# Patient Record
Sex: Male | Born: 1970 | Race: White | Hispanic: No | Marital: Married | Smoking: Never smoker
Health system: Southern US, Community
[De-identification: ages and names within clinical notes are randomized; demographics above are authoritative.]

## PROBLEM LIST (undated history)

## (undated) DIAGNOSIS — E119 Type 2 diabetes mellitus without complications: Secondary | ICD-10-CM

## (undated) DIAGNOSIS — E669 Obesity, unspecified: Secondary | ICD-10-CM

---

## 1999-01-28 ENCOUNTER — Encounter: Payer: Self-pay | Admitting: Family Medicine

## 1999-01-28 ENCOUNTER — Ambulatory Visit (HOSPITAL_COMMUNITY): Admission: RE | Admit: 1999-01-28 | Discharge: 1999-01-28 | Payer: Self-pay | Admitting: Family Medicine

## 2000-04-17 ENCOUNTER — Other Ambulatory Visit: Admission: RE | Admit: 2000-04-17 | Discharge: 2000-04-17 | Payer: Self-pay | Admitting: Family Medicine

## 2003-03-17 ENCOUNTER — Emergency Department (HOSPITAL_COMMUNITY): Admission: EM | Admit: 2003-03-17 | Discharge: 2003-03-18 | Payer: Self-pay

## 2009-01-28 ENCOUNTER — Emergency Department (HOSPITAL_COMMUNITY): Admission: EM | Admit: 2009-01-28 | Discharge: 2009-01-28 | Payer: Self-pay | Admitting: Emergency Medicine

## 2009-02-12 ENCOUNTER — Ambulatory Visit (HOSPITAL_COMMUNITY): Admission: RE | Admit: 2009-02-12 | Discharge: 2009-02-13 | Payer: Self-pay | Admitting: Neurosurgery

## 2009-12-14 ENCOUNTER — Inpatient Hospital Stay (HOSPITAL_COMMUNITY): Admission: AD | Admit: 2009-12-14 | Discharge: 2009-12-16 | Payer: Self-pay | Admitting: Internal Medicine

## 2010-10-21 LAB — HEMOGLOBIN A1C: Hgb A1c MFr Bld: 10.9 % — ABNORMAL HIGH (ref <5.7)

## 2010-10-21 LAB — BASIC METABOLIC PANEL
BUN: 7 mg/dL (ref 6–23)
CO2: 24 mEq/L (ref 19–32)
Calcium: 8.7 mg/dL (ref 8.4–10.5)
Chloride: 105 mEq/L (ref 96–112)
Creatinine, Ser: 0.76 mg/dL (ref 0.4–1.5)
GFR calc Af Amer: >60 mL/min (ref >60)
GFR calc non Af Amer: >60 mL/min (ref >60)
Glucose, Bld: 253 mg/dL — ABNORMAL HIGH (ref 70–99)
Potassium: 3.9 mEq/L (ref 3.5–5.1)
Sodium: 136 mEq/L (ref 135–145)

## 2010-10-21 LAB — CBC
HCT: 45.2 % (ref 39.0–52.0)
Hemoglobin: 15.7 g/dL (ref 13.0–17.0)
MCHC: 34.7 g/dL (ref 30.0–36.0)
MCV: 90.9 fL (ref 78.0–100.0)
Platelets: 158 10*3/uL (ref 150–400)
RBC: 4.97 MIL/uL (ref 4.22–5.81)
RDW: 12.9 % (ref 11.5–15.5)
WBC: 5.9 10*3/uL (ref 4.0–10.5)

## 2010-10-21 LAB — GLUCOSE, CAPILLARY: Glucose-Capillary: 248 mg/dL — ABNORMAL HIGH (ref 70–99)

## 2010-10-22 LAB — DIFFERENTIAL
Eosinophils Absolute: 0.3 10*3/uL (ref 0.0–0.7)
Lymphocytes Relative: 24 % (ref 12–46)
Lymphs Abs: 2 10*3/uL (ref 0.7–4.0)
Monocytes Relative: 9 % (ref 3–12)
Neutrophils Relative %: 64 % (ref 43–77)

## 2010-10-22 LAB — CBC
MCHC: 35.2 g/dL (ref 30.0–36.0)
MCV: 89.6 fL (ref 78.0–100.0)
RDW: 12.3 % (ref 11.5–15.5)

## 2010-10-22 LAB — URINALYSIS, ROUTINE W REFLEX MICROSCOPIC
Bilirubin Urine: NEGATIVE
Hgb urine dipstick: NEGATIVE
Nitrite: NEGATIVE
Protein, ur: NEGATIVE mg/dL
Urobilinogen, UA: 0.2 mg/dL (ref 0.0–1.0)

## 2010-10-22 LAB — URINE CULTURE
Colony Count: NO GROWTH
Culture: NO GROWTH
Special Requests: NEGATIVE

## 2010-10-22 LAB — COMPREHENSIVE METABOLIC PANEL
ALT: 86 U/L — ABNORMAL HIGH (ref 0–53)
AST: 73 U/L — ABNORMAL HIGH (ref 0–37)
CO2: 28 mEq/L (ref 19–32)
Calcium: 8.8 mg/dL (ref 8.4–10.5)
Creatinine, Ser: 0.88 mg/dL (ref 0.4–1.5)
GFR calc Af Amer: >60 mL/min (ref >60)
GFR calc non Af Amer: >60 mL/min (ref >60)
Glucose, Bld: 252 mg/dL — ABNORMAL HIGH (ref 70–99)
Sodium: 134 mEq/L — ABNORMAL LOW (ref 135–145)
Total Protein: 6.1 g/dL (ref 6.0–8.3)

## 2010-10-22 LAB — GLUCOSE, CAPILLARY
Glucose-Capillary: 238 mg/dL — ABNORMAL HIGH (ref 70–99)
Glucose-Capillary: 295 mg/dL — ABNORMAL HIGH (ref 70–99)

## 2010-10-22 LAB — MAGNESIUM: Magnesium: 2 mg/dL (ref 1.5–2.5)

## 2010-10-22 LAB — PHOSPHORUS: Phosphorus: 2.8 mg/dL (ref 2.3–4.6)

## 2010-11-10 LAB — BASIC METABOLIC PANEL
BUN: 12 mg/dL (ref 6–23)
CO2: 28 mEq/L (ref 19–32)
Chloride: 103 mEq/L (ref 96–112)
Glucose, Bld: 158 mg/dL — ABNORMAL HIGH (ref 70–99)
Potassium: 4.2 mEq/L (ref 3.5–5.1)
Sodium: 139 mEq/L (ref 135–145)

## 2010-11-10 LAB — CBC
HCT: 51.7 % (ref 39.0–52.0)
Hemoglobin: 18.2 g/dL — ABNORMAL HIGH (ref 13.0–17.0)
MCHC: 35.1 g/dL (ref 30.0–36.0)
MCV: 90.2 fL (ref 78.0–100.0)
Platelets: 191 10*3/uL (ref 150–400)
RDW: 12.9 % (ref 11.5–15.5)

## 2010-12-17 NOTE — Op Note (Signed)
Hanson, Levi               ACCOUNT NO.:  [id number]   MEDICAL RECORD NO.:  [id number]          PATIENT TYPE:  OIB   LOCATION:  3528                         FACILITY:  [facility]   PHYSICIAN:  Coletta Memos, M.D.     DATE OF BIRTH:  Dec 14, 1970   DATE OF PROCEDURE:  02/12/2009  DATE OF DISCHARGE:                               OPERATIVE REPORT   PREOPERATIVE DIAGNOSES:  1. Displaced disk, left L5-S1.  2. Left S1 radiculopathy.   POSTOPERATIVE DIAGNOSES:  1. Displaced disk, left L5-S1.  2. Left S1 radiculopathy.   PROCEDURE:  Left L5-S1 semi-hemilaminectomy and diskectomy with  microdissection.   COMPLICATIONS:  None.   SURGEON:  Coletta Memos, MD   ASSISTANT:  Hewitt Shorts, MD   INDICATIONS:  Mr. Hicks is a young man, 40 year old, who presents with  acute onset of severe pain in his left lower extremity.  I had seen him  previously for back and left lower extremity pain.  We have been able to  treat this successfully using conservative means.  However, at this time  he has had an exacerbation and he is requesting surgery.  MRI shows a  herniated disk, which is now larger than it had been on the last 2 years  ago.  I offered and he agreed to undergo operative decompression.   OPERATIVE NOTE:  Mr. Komatsu was brought to the operating room intubated  and placed under general anesthetic.  He was rolled prone onto a Wilson  frame and all pressure points were properly padded.  His back was  prepped and he was draped in a sterile fashion.  I opened the skin after  infiltrating lidocaine 0.5% 1:200,000 epinephrine.  I opened the skin  and took this down through subcutaneous fat so I reached the  thoracolumbar fascia.  I then exposed the lamina of what turned out to  be on x-ray, L5-S1.  I placed self-retaining retractor and performed a  semi-hemilaminectomy of L5 using a high-speed drill.  I used a Kerrison  punch to remove more bone and then eventually removed the  ligamentum  flavum exposing the thecal sac in the L5-S1 interlaminar space.  I  retracted that medially and with Dr. Earl Gala assistance along with  microdissection we opened the disk space and removed what was a great  deal of degenerated disks as predicted by the MRI.  We did this until  the S1 root was fully  decompressed.  I then irrigated.  I then closed wound with Dr.  Earl Gala assistance at L5-S1.  We reapproximated the wound in the  layered fashion.   The nerve root was inspected prior to closure in the rostral, caudal,  medial, and lateral directions.           ______________________________  Coletta Memos, M.D.     KC/MEDQ  D:  02/12/2009  T:  02/13/2009  Job:  [id number]

## 2011-02-17 ENCOUNTER — Other Ambulatory Visit (HOSPITAL_COMMUNITY): Payer: Self-pay | Admitting: Family Medicine

## 2011-02-24 ENCOUNTER — Ambulatory Visit (HOSPITAL_COMMUNITY)
Admission: RE | Admit: 2011-02-24 | Discharge: 2011-02-24 | Disposition: A | Discharge status: Home / Self Care | Payer: Commercial Managed Care - PPO | Source: Ambulatory Visit | Attending: Family Medicine | Admitting: Family Medicine

## 2011-02-24 DIAGNOSIS — K7689 Other specified diseases of liver: Secondary | ICD-10-CM | POA: Insufficient documentation

## 2011-02-24 DIAGNOSIS — R945 Abnormal results of liver function studies: Secondary | ICD-10-CM | POA: Insufficient documentation

## 2013-12-24 ENCOUNTER — Emergency Department (HOSPITAL_COMMUNITY)
Admission: EM | Admit: 2013-12-24 | Discharge: 2013-12-24 | Disposition: A | Discharge status: Home / Self Care | Payer: Managed Care, Other (non HMO) | Attending: Emergency Medicine | Admitting: Emergency Medicine

## 2013-12-24 ENCOUNTER — Encounter (HOSPITAL_COMMUNITY): Payer: Self-pay | Admitting: Emergency Medicine

## 2013-12-24 DIAGNOSIS — Y9389 Activity, other specified: Secondary | ICD-10-CM | POA: Insufficient documentation

## 2013-12-24 DIAGNOSIS — E119 Type 2 diabetes mellitus without complications: Secondary | ICD-10-CM | POA: Insufficient documentation

## 2013-12-24 DIAGNOSIS — F17200 Nicotine dependence, unspecified, uncomplicated: Secondary | ICD-10-CM | POA: Insufficient documentation

## 2013-12-24 DIAGNOSIS — IMO0001 Reserved for inherently not codable concepts without codable children: Secondary | ICD-10-CM

## 2013-12-24 DIAGNOSIS — Z23 Encounter for immunization: Secondary | ICD-10-CM | POA: Insufficient documentation

## 2013-12-24 DIAGNOSIS — S61209A Unspecified open wound of unspecified finger without damage to nail, initial encounter: Secondary | ICD-10-CM | POA: Insufficient documentation

## 2013-12-24 DIAGNOSIS — E669 Obesity, unspecified: Secondary | ICD-10-CM | POA: Insufficient documentation

## 2013-12-24 DIAGNOSIS — W298XXA Contact with other powered powered hand tools and household machinery, initial encounter: Secondary | ICD-10-CM | POA: Insufficient documentation

## 2013-12-24 DIAGNOSIS — Y929 Unspecified place or not applicable: Secondary | ICD-10-CM | POA: Insufficient documentation

## 2013-12-24 HISTORY — DX: Obesity, unspecified: E66.9

## 2013-12-24 HISTORY — DX: Type 2 diabetes mellitus without complications: E11.9

## 2013-12-24 MED ORDER — TETANUS-DIPHTH-ACELL PERTUSSIS 5-2.5-18.5 LF-MCG/0.5 IM SUSP
0.5000 mL | Freq: Once | INTRAMUSCULAR | Status: AC
  Administered 2013-12-24: 0.5 mL via INTRAMUSCULAR
  Filled 2013-12-24: qty 0.5

## 2013-12-24 MED ORDER — HYDROCODONE-ACETAMINOPHEN 5-325 MG PO TABS: 1.0000 | ORAL_TABLET | Freq: Four times a day (QID) | ORAL | Status: AC | PRN

## 2013-12-24 NOTE — ED Notes (Signed)
Reports cutting left index finger when using hand saw. Bleeding controlled, unknown tetanus.

## 2013-12-24 NOTE — Discharge Instructions (Signed)
Have your suture removed in 7 days.  Follow instruction below.   Fingertip Injuries and Amputations Fingertip injuries are common and often get injured because they are last to escape when pulling your hand out of harm's way. You have amputated (cut off) part of your finger. How this turns out depends largely on how much was amputated. If just the tip is amputated, often the end of the finger will grow back and the finger may return to much the same as it was before the injury.  If more of the finger is missing, your caregiver has done the best with the tissue remaining to allow you to keep as much finger as is possible. Your caregiver after checking your injury has tried to leave you with a painless fingertip that has durable, feeling skin. If possible, your caregiver has tried to maintain the finger's length and appearance and preserve its fingernail.  Please read the instructions outlined below and refer to this sheet in the next few weeks. These instructions provide you with general information on caring for yourself. Your caregiver may also give you specific instructions. While your treatment has been done according to the most current medical practices available, unavoidable complications occasionally occur. If you have any problems or questions after discharge, please call your caregiver. HOME CARE INSTRUCTIONS   You may resume normal diet and activities as directed or allowed.  Keep your hand elevated above the level of your heart. This helps decrease pain and swelling.  Keep ice packs (or a bag of ice wrapped in a towel) on the injured area for 15-20 minutes, 03-04 times per day, for the first two days.  Change dressings if necessary or as directed.  Clean the wound daily or as directed.  Only take over-the-counter or prescription medicines for pain, discomfort, or fever as directed by your caregiver.  Keep appointments as directed. SEEK IMMEDIATE MEDICAL CARE IF:  You develop  redness, swelling, numbness or increasing pain in the wound.  There is pus coming from the wound.  You develop an unexplained oral temperature above 102 F (38.9 C) or as your caregiver suggests.  There is a foul (bad) smell coming from the wound or dressing.  There is a breaking open of the wound (edges not staying together) after sutures or staples have been removed. MAKE SURE YOU:   Understand these instructions.  Will watch your condition.  Will get help right away if you are not doing well or get worse. Document Released: 06/11/2005 Document Revised: 10/13/2011 Document Reviewed: 05/10/2008 [facility] Patient Information 2014 [place], [place].

## 2013-12-24 NOTE — ED Notes (Signed)
Laceration to left index finger with hand-saw. Continues to ooze blood. Direct pressure applied with DSD.

## 2013-12-24 NOTE — ED Provider Notes (Signed)
CSN: [id number]     Arrival date & time 12/24/13  1048 History  This chart was scribed for Fayrene Helper, PA-C, working with Nelia Shi, MD by Blanchard Kelch, ED Scribe. This patient was seen in room TR06C/TR06C and the patient's care was started at 11:46 AM.      Chief Complaint  Patient presents with  . Extremity Laceration     The history is provided by the patient. No language interpreter was used.    HPI Comments: Levi Hanson is a 43 y.o. male who presents to the Emergency Department due to a left index finger injury that occurred an hour ago. He was using a hand saw when it slipped and cut his left index finger. The area is actively bleeding but is controlled with pressure. He reports constant pain to the affected finger. He is not up to date on his Tetanus vaccination. Denies numbness.  Is RHD.     Past Medical History  Diagnosis Date  . Diabetes mellitus without complication   . Obesity    History reviewed. No pertinent past surgical history. History reviewed. No pertinent family history. History  Substance Use Topics  . Smoking status: Current Every Day Smoker    Types: Cigarettes  . Smokeless tobacco: Not on file  . Alcohol Use: No    Review of Systems  Constitutional: Negative for fever and chills.  Musculoskeletal: Negative for arthralgias.  Skin: Positive for wound.      Allergies  Zithromax  Home Medications   Prior to Admission medications   Not on File   Triage Vitals: BP 142/91  Pulse 84  Temp(Src) 98 F (36.7 C) (Oral)  Resp 18  SpO2 98%  Physical Exam  Nursing note and vitals reviewed. Constitutional: He is oriented to person, place, and time. He appears well-developed and well-nourished. No distress.  HENT:  Head: Normocephalic and atraumatic.  Eyes: EOM are normal.  Neck: Neck supple. No tracheal deviation present.  Cardiovascular: Normal rate and intact distal pulses.   Pulmonary/Chest: Effort normal. No respiratory distress.   Musculoskeletal: Normal range of motion.  Neurological: He is alert and oriented to person, place, and time.  Skin: Skin is warm and dry.  Left index finger 2 cm jagged skin laceration in oblique fashion to the pad of the finger medially. Actively bleeding. Intact distal pulses with brisk cap refill and no nail involvement. No joint involvement.   Psychiatric: He has a normal mood and affect. His behavior is normal.    ED Course  Procedures (including critical care time)  DIAGNOSTIC STUDIES: Oxygen Saturation is 98% on room air, normal by my interpretation.    COORDINATION OF CARE: 11:49 AM -Will repair laceration. Patient verbalizes understanding and agrees with treatment plan.  LACERATION REPAIR Performed by: Fayrene Helper Authorized by: Fayrene Helper Consent: Verbal consent obtained. Risks and benefits: risks, benefits and alternatives were discussed Consent given by: patient Patient identity confirmed: provided demographic data Prepped and Draped in normal sterile fashion Wound explored  Laceration Location: L index finger, pad of finger  Laceration Length: 2cm  No Foreign Bodies seen or palpated  Anesthesia: digital nerve block  Local anesthetic: lidocaine 2% w/o epinephrine  Anesthetic total: 4 ml  Irrigation method: syringe Amount of cleaning: standard  Skin closure: prolene 5.0, and sterile strip  Number of sutures: 1  Technique: simple interrupted, skin approximation, sharp debridement  Patient tolerance: Patient tolerated the procedure well with no immediate complications.   Labs Review Labs Reviewed -  No data to display  Imaging Review No results found.   EKG Interpretation None      MDM   Final diagnoses:  Laceration of second finger of left hand    BP 142/91  Pulse 84  Temp(Src) 98 F (36.7 C) (Oral)  Resp 18  SpO2 98%  I personally performed the services described in this documentation, which was scribed in my presence. The recorded  information has been reviewed and is accurate.    Fayrene HelperBowie Chadwin Fury, PA-C 12/24/13 1251

## 2013-12-24 NOTE — ED Provider Notes (Signed)
Medical screening examination/treatment/procedure(s) were performed by non-physician practitioner and as supervising physician I was immediately available for consultation/collaboration.    Saanvika Vazques L Averly Ericson, MD 12/24/13 1401 

## 2015-05-28 ENCOUNTER — Other Ambulatory Visit: Payer: Self-pay | Admitting: Orthopedic Surgery

## 2015-06-25 ENCOUNTER — Encounter (HOSPITAL_BASED_OUTPATIENT_CLINIC_OR_DEPARTMENT_OTHER): Admission: RE | Payer: Self-pay | Source: Ambulatory Visit

## 2015-06-25 ENCOUNTER — Ambulatory Visit (HOSPITAL_BASED_OUTPATIENT_CLINIC_OR_DEPARTMENT_OTHER)
Admission: RE | Admit: 2015-06-25 | Payer: Managed Care, Other (non HMO) | Source: Ambulatory Visit | Admitting: Orthopedic Surgery

## 2015-06-25 SURGERY — SHOULDER ARTHROSCOPY WITH SUBACROMIAL DECOMPRESSION
Anesthesia: Choice | Site: Shoulder | Laterality: Right

## 2016-01-04 ENCOUNTER — Ambulatory Visit: Payer: Managed Care, Other (non HMO) | Admitting: Podiatry

## 2016-02-22 ENCOUNTER — Other Ambulatory Visit: Payer: Self-pay | Admitting: Physician Assistant

## 2016-02-22 DIAGNOSIS — M79672 Pain in left foot: Secondary | ICD-10-CM

## 2016-03-05 ENCOUNTER — Ambulatory Visit
Admission: RE | Admit: 2016-03-05 | Discharge: 2016-03-05 | Disposition: A | Discharge status: Home / Self Care | Payer: Managed Care, Other (non HMO) | Source: Ambulatory Visit | Attending: Physician Assistant | Admitting: Physician Assistant

## 2016-03-05 DIAGNOSIS — M79672 Pain in left foot: Secondary | ICD-10-CM

## 2016-03-05 MED ORDER — GADOBENATE DIMEGLUMINE 529 MG/ML IV SOLN
20.0000 mL | Freq: Once | INTRAVENOUS | Status: AC | PRN
  Administered 2016-03-05: 20 mL via INTRAVENOUS

## 2017-11-30 ENCOUNTER — Ambulatory Visit: Payer: Managed Care, Other (non HMO) | Admitting: Family Medicine

## 2017-12-04 ENCOUNTER — Encounter: Payer: Self-pay | Admitting: Family Medicine

## 2017-12-22 IMAGING — MR MR FOOT*L* WO/W CM
8 of 9 series · 33 of 40 positions shown · IV contrast (multihance)
Comparison: None.

CLINICAL DATA: Dorsal left foot and ankle pain and swelling for 2
weeks in a diabetic patient.

Creatinine was obtained on site at [HOSPITAL] at [HOSPITAL].Results: Creatinine 0.8 mg/dL.
EXAM:
MRI OF THE LEFT ANKLE WITHOUT AND WITH CONTRAST
TECHNIQUE: Multiplanar, multisequence MR imaging was performed both before and
after administration of intravenous contrast.
CONTRAST:  20 ml MULTIHANCE GADOBENATE DIMEGLUMINE 529 MG/ML IV SOLN

[Series 3: T1 · sagittal · 3.5mm · 0.69mm/px · 2 of 21 slices shown]
[im 1/21]
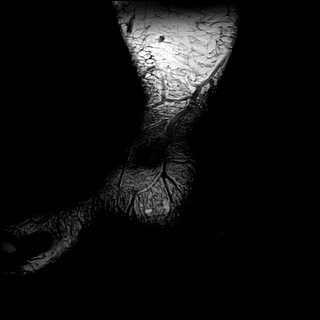
[im 21/21]
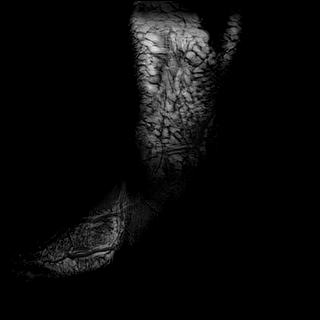

[Series 4: T2 fat-sat · sagittal · 3.5mm · 0.86mm/px · 3 of 21 slices shown (1 of 3)]
[im 1/21]
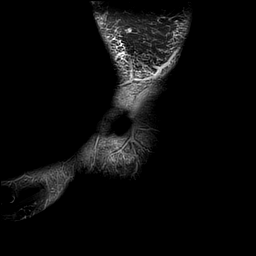
[im 11/21]
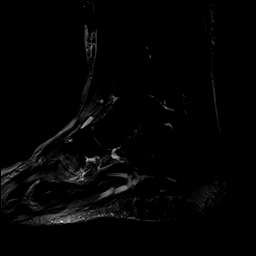
[im 21/21]
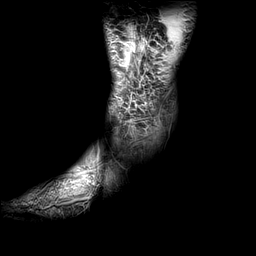

[Series 5: T2 fat-sat · coronal · 4.0mm · 0.59mm/px · 6 of 39 slices shown (2 of 3)]
[im 1/39]
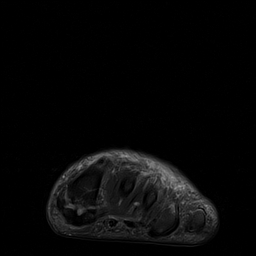
[im 8/39]
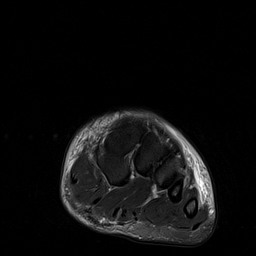
[im 16/39]
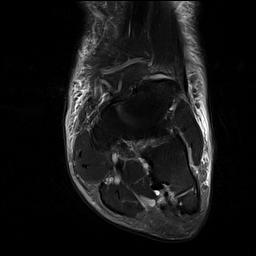
[im 23/39]
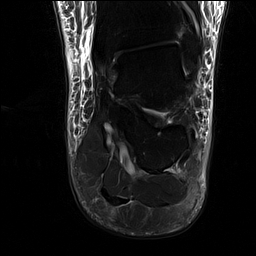
[im 31/39]
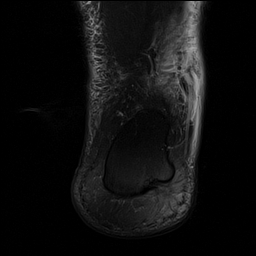
[im 39/39]
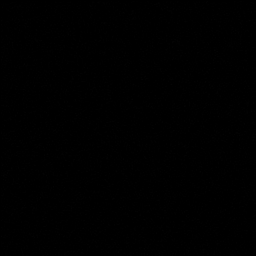

[Series 6: T2 fat-sat · axial · 4.0mm · 0.37mm/px · z∈[-114,+23]mm · 5 of 32 slices shown (3 of 3)]
[im 1/32]
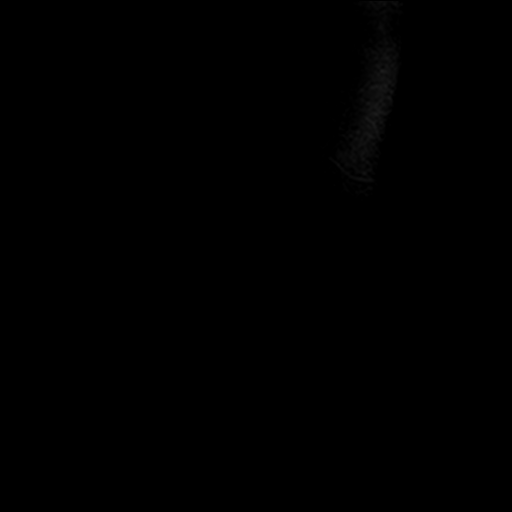
[im 8/32]
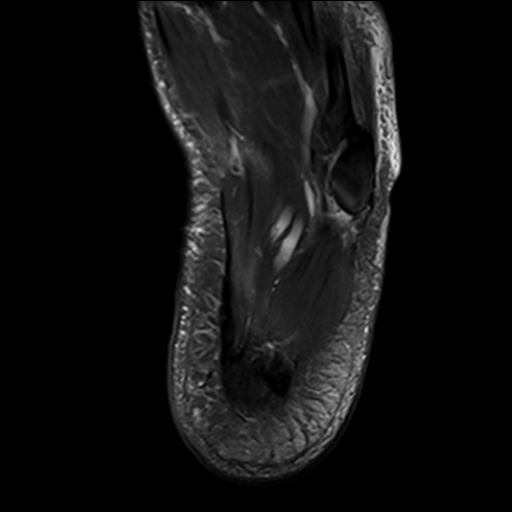
[im 16/32]
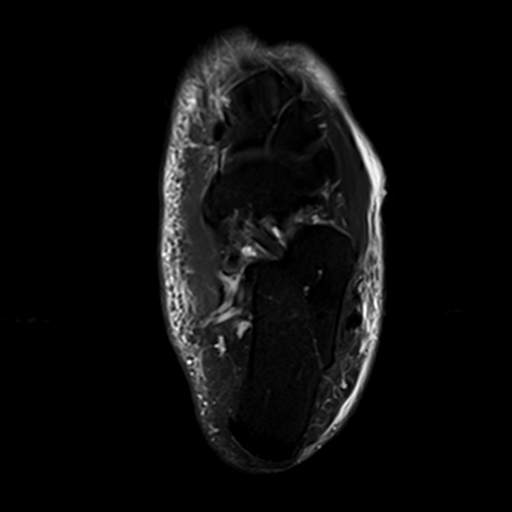
[im 24/32]
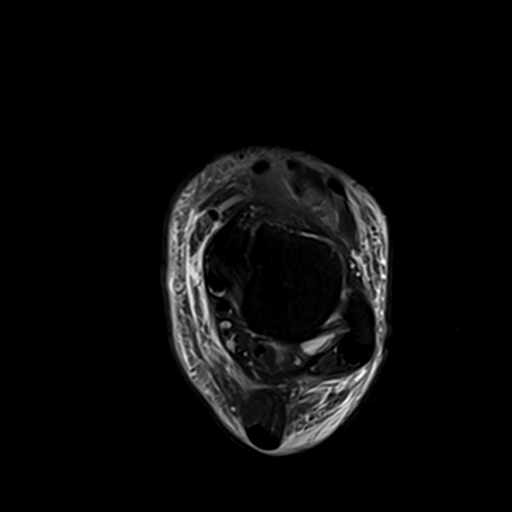
[im 32/32]
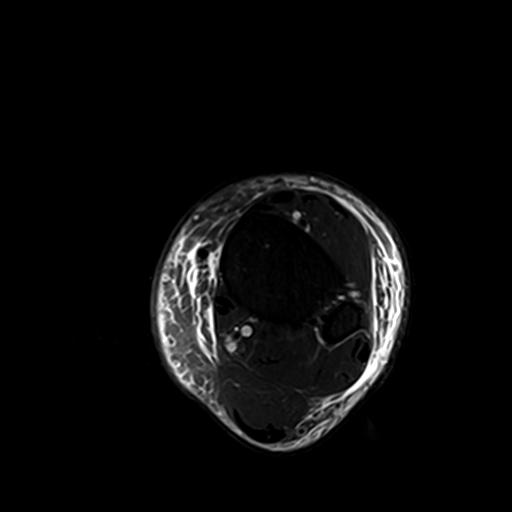

[Series 7: PD fat-sat · axial · 4.0mm · 0.59mm/px · z∈[-114,+23]mm · 5 of 32 slices shown]
[im 1/32]
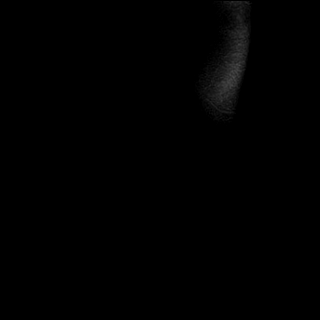
[im 8/32]
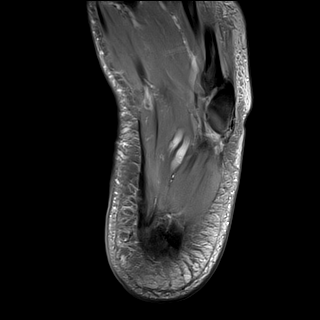
[im 16/32]
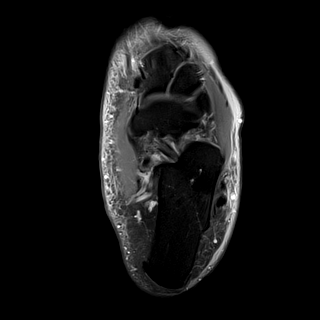
[im 24/32]
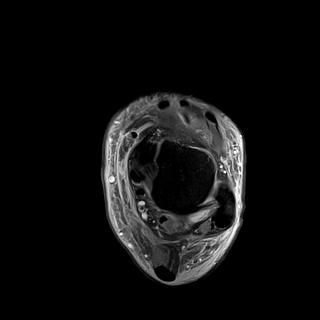
[im 32/32]
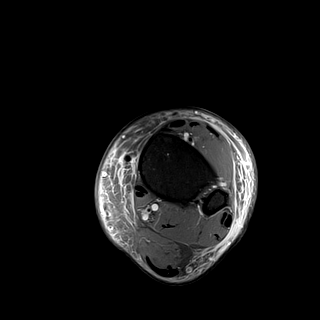

[Series 8: T1 fat-sat · axial · 4.0mm · 0.37mm/px · z∈[-113,+24]mm · 5 of 32 slices shown (1 of 2)]
[im 1/32]
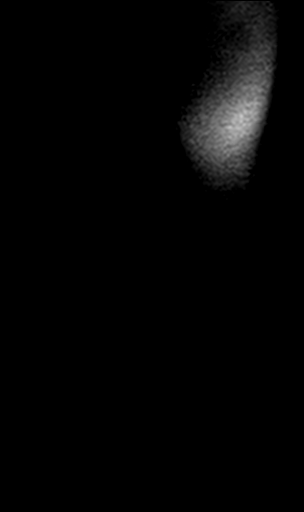
[im 8/32]
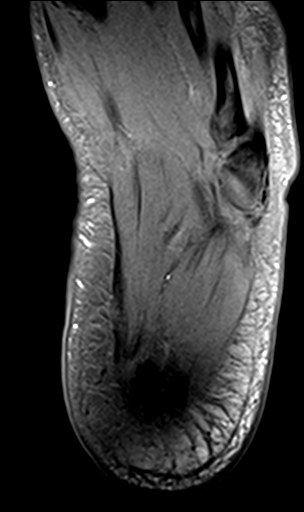
[im 16/32]
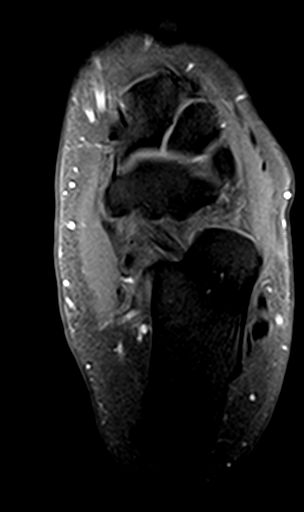
[im 24/32]
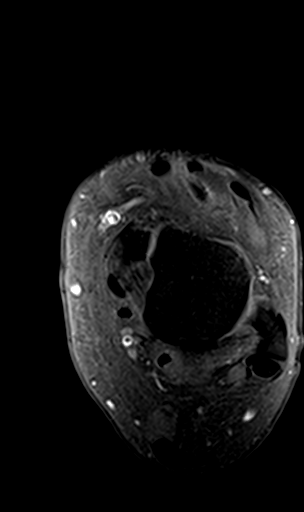
[im 32/32]
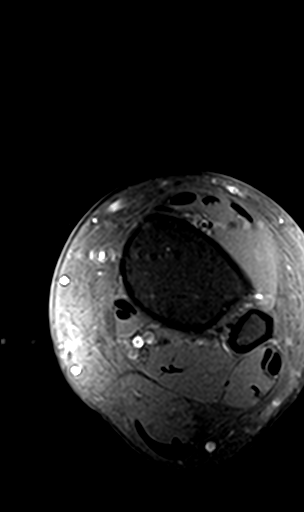

[Series 9: T1 fat-sat · axial · 4.0mm · 0.37mm/px · z∈[-113,+24]mm · 5 of 32 slices shown (2 of 2)]
[im 1/32]
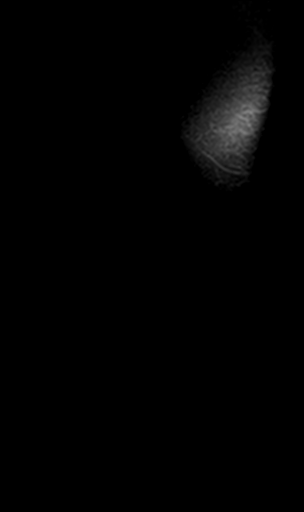
[im 8/32]
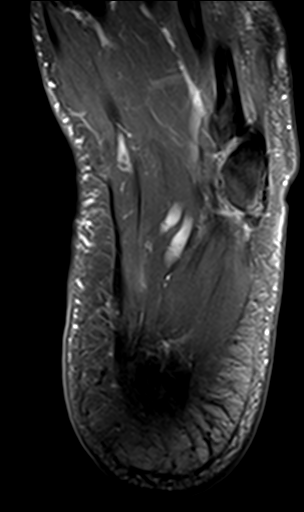
[im 16/32]
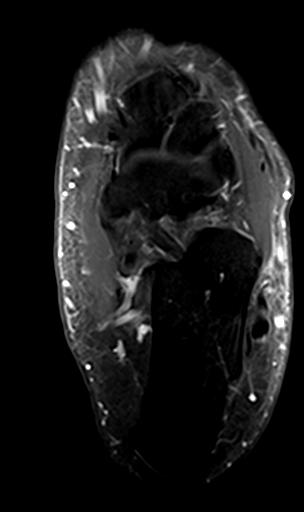
[im 24/32]
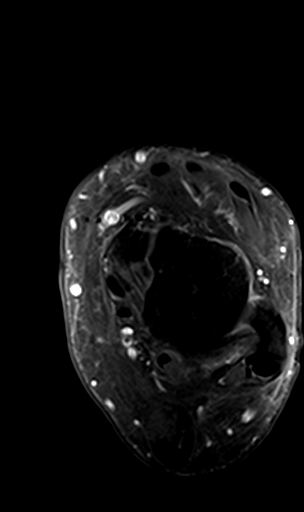
[im 32/32]
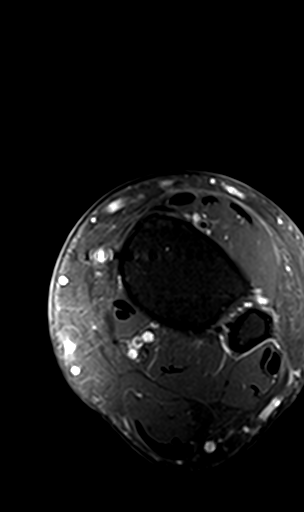

[Series 10: post cor fs · coronal · 4.0mm · 0.29mm/px · 2 of 39 slices shown]
[im 1/39]
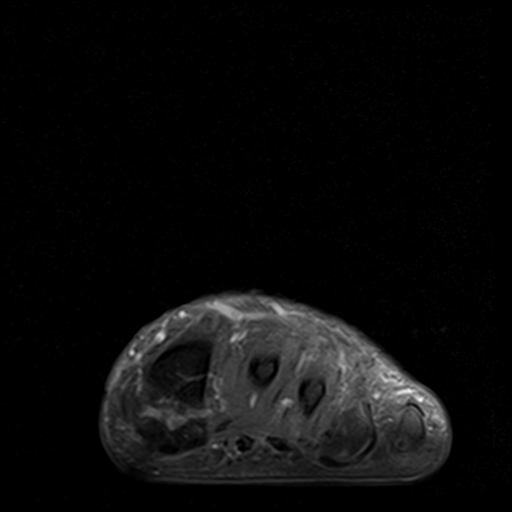
[im 8/39]
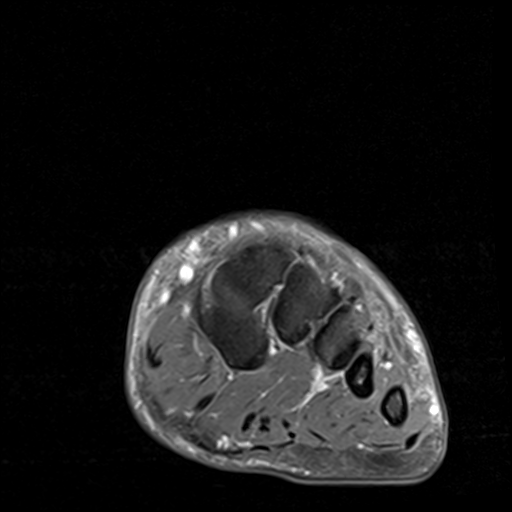

[33 of 40 positions shown; findings below may reference images not displayed]

FINDINGS: Markers in the region of concern along the medial aspect of the
ankle and over the dorsum of the second metatarsal are identified.
Intense subcutaneous edema with some postcontrast enhancement of
subcutaneous fatty tissues is seen about the ankle and imaged foot.
No focal fluid collection or bone marrow signal abnormality is
identified. There is no joint effusion. Tendons and ligamentous
structures about the ankle are intact and unremarkable. No mass is
seen. Plantar fascia is unremarkable.
IMPRESSION: Findings compatible with cellulitis about the left ankle without
abscess, osteomyelitis or septic joint.

Negative for ligament or tendon tear.

## 2018-07-12 ENCOUNTER — Telehealth: Payer: Self-pay

## 2018-07-12 ENCOUNTER — Encounter: Payer: Self-pay | Admitting: Internal Medicine

## 2018-07-12 ENCOUNTER — Ambulatory Visit (INDEPENDENT_AMBULATORY_CARE_PROVIDER_SITE_OTHER): Payer: Managed Care, Other (non HMO) | Admitting: Internal Medicine

## 2018-07-12 VITALS — BP 118/76 | HR 92 | Ht 69.0 in | Wt 251.0 lb

## 2018-07-12 DIAGNOSIS — E1165 Type 2 diabetes mellitus with hyperglycemia: Secondary | ICD-10-CM | POA: Diagnosis not present

## 2018-07-12 MED ORDER — SEMAGLUTIDE(0.25 OR 0.5MG/DOS) 2 MG/1.5ML ~~LOC~~ SOPN: 0.5000 mg | PEN_INJECTOR | SUBCUTANEOUS | 6 refills | Status: AC

## 2018-07-12 MED ORDER — INSULIN PEN NEEDLE 32G X 6 MM MISC: 3 refills | Status: AC

## 2018-07-12 MED ORDER — BASAGLAR KWIKPEN 100 UNIT/ML ~~LOC~~ SOPN: 25.0000 [IU] | PEN_INJECTOR | Freq: Every day | SUBCUTANEOUS | 3 refills | Status: AC

## 2018-07-12 NOTE — Patient Instructions (Addendum)
-   Check sugar twice a day (fasting and bedtime) - Continue Metformin 1000 mg twice a day with meals - Start Basaglar 25 units daily - Start Ozempic 0.25 mg weekly  - Choose healthy, lower carb lower calorie snacks: toss salad, cooked vegetables, cottage cheese, peanut butter, low fat cheese / string cheese, lower sodium deli meat, tuna salad or chicken salad   HOW TO TREAT LOW BLOOD SUGARS (Blood sugar LESS THAN 70 MG/DL)  Please follow the RULE OF 15 for the treatment of hypoglycemia treatment (when your (blood sugars are less than 70 mg/dL)    STEP 1: Take 15 grams of carbohydrates when your blood sugar is low, which includes:   3-4 GLUCOSE TABS  OR  3-4 OZ OF JUICE OR REGULAR SODA OR  ONE TUBE OF GLUCOSE GEL     STEP 2: RECHECK blood sugar in 15 MINUTES STEP 3: If your blood sugar is still low at the 15 minute recheck --> then, go back to STEP 1 and treat AGAIN with another 15 grams of carbohydrates.

## 2018-07-12 NOTE — Progress Notes (Signed)
Name: Levi Hanson  MRN/ DOB: [id number], [birth year]   Age/ Sex: 47 y.o., male    PCP: No primary care provider on file.   Reason for Endocrinology Evaluation: Type 2 Diabetes Mellitus     Date of Initial Endocrinology Visit: 07/12/2018     PATIENT IDENTIFIER: Levi Hanson is a 47 y.o. male with a past medical history of HTN, T2DM . The patient presented for initial endocrinology clinic visit on 07/12/2018 for consultative assistance with his diabetes management.    HPI: Levi Hanson is accompanied by his wife today    Diagnosed with T2DM ~ 8 yrs  Prior Medications tried/Intolerance: Trulicity (side pain), Januvia (no intolerance),   Currently checking blood sugars 3 x / day,  Before .  Hypoglycemia episodes : no              Hemoglobin A1c has ranged from 10.9 % in 2011, peaking at 13.3% in 02/2018. Patient required assistance for hypoglycemia: no Patient has required hospitalization within the last 1 year from hyper or hypoglycemia: no  In terms of diet, the patient avoids sugar- sweetened beverages, eats 3 regular meals and 2 snacks.   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 But Metformin and lantus were restarted recently due to hyperglycemia. Pt went to pick up Lantus but was not covered by insurance.     HOME DIABETES REGIMEN: Lantus 45 units  Metformin 1000 mg BID    Statin: Yes ACE-I/ARB: No Prior Diabetic Education: Yes    METER DOWNLOAD SUMMARY: Did not bring meter     DIABETIC COMPLICATIONS: Microvascular complications:    Denies: neuropathy, nephropathy   Last eye exam: Completed 07/2018  Macrovascular complications:    Denies: CAD, PVD, CVA   PAST HISTORY: Past Medical History:  Past Medical History:  Diagnosis Date  . Diabetes mellitus without complication (HCC)   . Obesity      Past Surgical History: History reviewed. No pertinent surgical history.   Social History:  reports that he has never smoked. He has never used smokeless tobacco. He reports that he does not drink alcohol or use drugs. Family History:  Family History  Problem Relation Age of Onset  . Thyroid cancer Mother   . Diabetes Maternal Grandmother      HOME MEDICATIONS: Allergies as of 07/12/2018      Reactions   Zithromax [azithromycin] Anaphylaxis   Throat swells      Medication List        Accurate as of 07/12/18  9:17 PM. Always use your most recent med list.          BASAGLAR KWIKPEN 100 UNIT/ML Sopn Inject 0.25 mLs (25 Units total) into the skin daily.   HYDROcodone-acetaminophen 5-325 MG tablet Commonly known as:  NORCO/VICODIN Take 1 tablet by mouth every 6 (six) hours as needed for severe pain.   Insulin Pen Needle 32G X 6 MM Misc Once daily   metFORMIN 500 MG 24 hr tablet Commonly known as:  GLUCOPHAGE-XR Take 1,000 mg by mouth daily at 12 noon.   pravastatin 40 MG tablet Commonly known as:  PRAVACHOL Take 40 mg by mouth daily at 12 noon.   Semaglutide(0.25 or 0.5MG /DOS) 2 MG/1.5ML Sopn Inject 0.5 mg into the skin once a week.  ALLERGIES: Allergies  Allergen Reactions  . Zithromax [Azithromycin] Anaphylaxis    Throat swells     REVIEW OF SYSTEMS: A comprehensive ROS was conducted with the patient and is negative except as per HPI and below:  Review of Systems  Constitutional: Positive for weight loss. Negative for fever.  HENT: Negative for congestion and sore throat.   Eyes: Negative for blurred vision and pain.  Respiratory: Negative for cough and shortness of breath.   Cardiovascular: Negative for chest pain and palpitations.  Gastrointestinal: Negative for abdominal pain and constipation.  Genitourinary: Negative for dysuria and frequency.  Skin: Negative.   Neurological: Negative for tingling and tremors.  Endo/Heme/Allergies:  Negative for polydipsia.  Psychiatric/Behavioral: Negative for depression. The patient is not nervous/anxious.       OBJECTIVE:   VITAL SIGNS: BP 118/76   Pulse 92   Ht 5\' 9"  (1.753 m)   Wt 251 lb (113.9 kg)   SpO2 98%   BMI 37.07 kg/m    PHYSICAL EXAM:  General: Pt appears well and is in NAD  Hydration: Well-hydrated with moist mucous membranes and good skin turgor  HEENT: Head: Unremarkable with good dentition. Oropharynx clear without exudate.  Eyes: External eye exam normal without stare, lid lag or exophthalmos.  EOM intact.  PERRL.  Neck: General: Supple without adenopathy or carotid bruits. Thyroid: Thyroid size normal.  No goiter or nodules appreciated. No thyroid bruit.  Lungs: Clear with good BS bilat with no rales, rhonchi, or wheezes  Heart: RRR with normal S1 and S2 and no gallops; no murmurs; no rub  Abdomen: Normoactive bowel sounds, soft, nontender, without masses or organomegaly palpable  Extremities:  Lower extremities - No pretibial edema. No lesions.  Skin: Normal texture and temperature to palpation. No rash noted. No Acanthosis nigricans/skin tags. No lipohypertrophy.  Neuro: MS is good with appropriate affect, pt is alert and Ox3    DM foot exam:  The skin of the feet is intact without sores or ulcerations. The pedal pulses are 2+ on right and 2+ on left. The sensation is intact to a screening 5.07, 10 gram monofilament bilaterally   DATA REVIEWED: 02/08/2018  A1c 13.3%       ASSESSMENT / PLAN / RECOMMENDATIONS:   1) Type 2 Diabetes Mellitus, Poorly Controlled, With out complications - Most recent A1c of 12.8 %. Goal A1c < 7.0 %.    Plan: GENERAL: I have discussed with the patient the pathophysiology of diabetes. We went over the natural progression of the disease. We talked about both insulin resistance and insulin deficiency. We stressed the importance of lifestyle changes including diet and exercise. I explained the complications associated  with diabetes including retinopathy, nephropathy, neuropathy as well as increased risk of cardiovascular disease. We went over the benefit seen with glycemic control.    I explained to the patient that diabetic patients are at higher than normal risk for amputations. The patient was informed that diabetes is the number one cause of non-traumatic amputations in [place].   I have discussed the preference to proceed with basal insulin to rapidly bring glucose down , we also discussed add on therapy such as SGLT-2 inhibitors, and GLP-1 agonists, we discussed the risk and benefit of each group. Pt opted to proceed with GLP-1 agonist  Mother with papillary thyroid cancer. Discussed contra-indication of GLP-1 agonists with medullary cancer.      MEDICATIONS:  Metformin 1000 mg bID  Ozempic 0.25 mg weekly   Basaglar 25 units daily  EDUCATION / INSTRUCTIONS:  BG monitoring instructions: Patient is instructed to check his blood sugars 2 times a day, fasting and bedtime.  Call [facility] if: BG persistently < 70 or > 300. . I reviewed the Rule of 15 for the treatment of hypoglycemia in detail with the patient. Literature supplied.   2) Diabetic complications:   Eye: Does not have known diabetic retinopathy.  Neuro/ Feet: Does not have known diabetic peripheral neuropathy.  Renal: Patient does not have known baseline CKD. He is not an ACEI/ARB at present.   3) Lipids: Patient is on a statin.   F/u in 6 weeks       Signed electronically by: Lyndle HerrlichAbby Jaralla Taniela Feltus, MD  [facility]  [facility] [address] [place], [place] [address] Phone: [phone] FAX: [phone]   CC: No primary care provider on file. No primary provider on file. Phone: None  Fax: None    Return to Endocrinology clinic as below: Future Appointments  Date Time Provider Department Center  08/23/2018  3:40 PM Leitha Hyppolite, Konrad DoloresIbtehal Jaralla, MD  [facility] None

## 2018-07-12 NOTE — Telephone Encounter (Signed)
Error

## 2018-07-19 ENCOUNTER — Telehealth: Payer: Self-pay | Admitting: Internal Medicine

## 2018-07-19 NOTE — Telephone Encounter (Signed)
Sonya from Dr. Patrica DuelHeidi Madry's office ph# [phone] called requesting medical records be sent to their office from patient's visits with Dr. Lonzo CloudShamleffer. Please call Sonya at the ph# listed herein to advise/or if questions.

## 2018-07-20 NOTE — Telephone Encounter (Signed)
Medical record dated on 07/12/2018 faxed to Dr Patrica DuelHeidi Madry's office per request.

## 2018-08-19 ENCOUNTER — Telehealth: Payer: Self-pay

## 2018-08-19 NOTE — Telephone Encounter (Signed)
Called pt and left voicemail to call back and reschedule appt.

## 2018-08-23 ENCOUNTER — Ambulatory Visit: Payer: Managed Care, Other (non HMO) | Admitting: Internal Medicine

## 2018-08-23 ENCOUNTER — Telehealth: Payer: Self-pay | Admitting: Internal Medicine

## 2018-08-23 NOTE — Telephone Encounter (Signed)
Called pt's pharmacy and informed them that they can provide the patient with any pen needle that is available at the cheapest cost to the pt. Pharmacist stated that all pen needles will be around the same cost because patient must meet the deductible again for this new year, but they will try several different brands as provide the patient with one that costs the lowest.

## 2018-08-23 NOTE — Telephone Encounter (Signed)
Patients wife is stating the RX sent over Insulin Pen Needle (BD PEN NEEDLE MICRO U/F) 32G X 6 MM MISC is to expensive and wants to know if there is an alternative. Please Advise, thanks

## 2018-09-09 ENCOUNTER — Ambulatory Visit: Payer: Managed Care, Other (non HMO) | Admitting: Internal Medicine

## 2018-09-27 ENCOUNTER — Ambulatory Visit: Payer: Managed Care, Other (non HMO) | Admitting: Internal Medicine

## 2018-09-27 NOTE — Progress Notes (Unsigned)
Name: Levi Hanson  Age/ Sex: 48 y.o., male   MRN/ DOB: [id number], 1970-12-21     PCP: Jonell Cluck, MD  Reason for Endocrinology Evaluation: Type 2 Diabetes Mellitus  Initial Endocrine Consultative Visit: 07/12/2018    PATIENT IDENTIFIER: Levi Hanson is a 48 y.o. male with a past medical history of T2DM and HTN. The patient has followed with Endocrinology clinic since 07/12/2018 for consultative assistance with management of his diabetes.  DIABETIC HISTORY:  Levi Hanson was diagnosed with T2DM in ~ 2012. He was initially on metformin and a basal insulin on initial diagnosis. He was able to get off all meds for ~ 6 months with lifestyle changes. But was restarted again in 2019 due to persistent hyperglycemia. He has tried trulicity ( side pains), and Januvia in the past.  and started insulin therapy approximately *** years after diagnosis. ***. His hemoglobin A1c has ranged from 10.9 % in 2011, peaking at 13.3% in 02/2018.  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  He checks his blood sugars *** times daily, preprandial to breakfast and ***. The patient has *** had hypoglycemic episodes since the last clinic visit, which typically occur *** x / - most often occuring ***. The patient is *** symptomatic with these episodes, with symptoms of {symptoms; hypoglycemia:9084048}. Otherwise, the patient {HAS/HAS NOT:522402} required any recent emergency interventions for hypoglycemia and {HAS/HAS NOT:522402} had recent hospitalizations secondary to hyper or hypoglycemic episodes.    ROS: As per HPI and as detailed below: ROS    HOME DIABETES REGIMEN:   Metformin 1000 mg bID  Ozempic 0.25 mg weekly   Basaglar 25 units daily    METER DOWNLOAD SUMMARY: Date range evaluated: *** Fingerstick Blood  Glucose Tests = *** Average Number Tests/Day = *** Overall Mean FS Glucose = *** Standard Deviation = ***  BG Ranges: Low = *** High = ***   Hypoglycemic Events/30 Days: BG < 50 = *** Episodes of symptomatic severe hypoglycemia = ***     HISTORY:  Past Medical History:  Past Medical History:  Diagnosis Date  . Diabetes mellitus without complication (HCC)   . Obesity      Past Surgical History: No past surgical history on file.  Social History:  reports that he has never smoked. He has never used smokeless tobacco. He reports that he does not drink alcohol or use drugs. Family History:  Family History  Problem Relation Age of Onset  . Thyroid cancer Mother   . Diabetes Maternal Grandmother       HOME MEDICATIONS: Allergies as of 09/27/2018      Reactions   Zithromax [azithromycin] Anaphylaxis   Throat swells      Medication List       Accurate as of September 27, 2018 12:14 PM. Always use your most recent med list.        BASAGLAR KWIKPEN 100 UNIT/ML Sopn Inject 0.25 mLs (25 Units total) into the skin daily.   HYDROcodone-acetaminophen 5-325 MG tablet Commonly known as:  NORCO/VICODIN Take 1 tablet by mouth every 6 (six) hours as needed for severe pain.   Insulin Pen Needle 32G X 6 MM Misc Commonly known as:  BD PEN NEEDLE MICRO U/F Once daily   metFORMIN 500 MG 24 hr tablet Commonly known as:  GLUCOPHAGE-XR Take 1,000 mg by mouth daily at 12 noon.  pravastatin 40 MG tablet Commonly known as:  PRAVACHOL Take 40 mg by mouth daily at 12 noon.   Semaglutide(0.25 or 0.5MG /DOS) 2 MG/1.5ML Sopn Commonly known as:  OZEMPIC (0.25 OR 0.5 MG/DOSE) Inject 0.5 mg into the skin once a week.        OBJECTIVE:   Vital Signs: There were no vitals taken for this visit.  Wt Readings from Last 3 Encounters:  07/12/18 251 lb (113.9 kg)  03/05/16 (!) 330 lb (149.7 kg)     Exam: General: Pt appears well and is in NAD  Neck: General: Supple without  adenopathy. Thyroid: Thyroid size normal.  No goiter or nodules appreciated. No thyroid bruit.  Lungs: Clear with good BS bilat with no rales, rhonchi, or wheezes  Heart: RRR with normal S1 and S2 and no gallops; no murmurs; no rub  Abdomen: Normoactive bowel sounds, soft, nontender, without masses or organomegaly palpable  Extremities: No pretibial edema. No tremor. Normal strength and motion throughout.  Skin: Normal texture and temperature to palpation. No rash noted. No Acanthosis nigricans/skin tags.   Neuro: MS is good with appropriate affect, pt is alert and Ox3       DM foot exam: 07/12/2018 The skin of the feet is intact without sores or ulcerations. The pedal pulses are 2+ on right and 2+ on left. The sensation is intact to a screening 5.07, 10 gram monofilament bilaterally         DATA REVIEWED:  02/08/2018  A1c 13.3%    ASSESSMENT / PLAN / RECOMMENDATIONS:   1) Type 2 Diabetes Mellitus, Poorly Controlled, With out complications - Most recent A1c of 12.8 %. Goal A1c < 7.0 %.    Plan: MEDICATIONS:  ***  EDUCATION / INSTRUCTIONS:  BG monitoring instructions: Patient is instructed to check his blood sugars *** times a day, ***.  Call [place] [facility] if: BG persistently < 70 or > 300. . I reviewed the Rule of 15 for the treatment of hypoglycemia in detail with the patient. Literature supplied.    F/U in ***    Signed electronically by: Lyndle Herrlich, MD  [facility]  [facility] [address]on [address] [place], [place] [address] Phone: [phone] FAX: [phone]   CC: No primary care provider on file. No primary provider on file. Phone: None  Fax: None  Return to Endocrinology clinic as below: Future Appointments  Date Time Provider Department Center  09/27/2018  3:40 PM Shamleffer, Konrad Dolores, MD [facility] None

## 2018-10-11 ENCOUNTER — Ambulatory Visit: Payer: Managed Care, Other (non HMO) | Admitting: Internal Medicine

## 2018-10-11 ENCOUNTER — Telehealth: Payer: Self-pay | Admitting: Internal Medicine

## 2018-10-11 NOTE — Progress Notes (Deleted)
Name: Levi Hanson  Age/ Sex: 48 y.o., male   MRN/ DOB: [id number], 1971-07-26     PCP: No primary care provider on file.   Reason for Endocrinology Evaluation: Type 2 diabetes Mellitus  Initial Endocrine Consultative Visit:  07/12/2018    PATIENT IDENTIFIER: Mr. Levi Hanson is a 48 y.o. male with a past medical history of HTN, and T2DM. The patient has followed with Endocrinology clinic since 07/12/2018 for consultative assistance with management of his diabetes.  DIABETIC HISTORY:  Mr. Levi Hanson was diagnosed with T2DM in 2011 , he has tried Trulicity, but caused slight pain, he was also tried on Januvia with no reported intolerance.  He has been on metformin since his diagnosis, insulin therapy started years ago. His hemoglobin A1c has ranged from 10.9 % in 2011, peaking at 13.3% in 02/2018.  On his initial visit to our clinic he was on metformin and Lantus. 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 Levi Hanson is here for follow-up on his diabetes management.  He missed his 6-week appointment back in January. He checks his blood sugars *** times daily, preprandial to breakfast and ***. The patient has *** had hypoglycemic episodes since the last clinic visit, which typically occur *** x / - most often occuring ***. The patient is *** symptomatic with these episodes, with symptoms of {symptoms; hypoglycemia:9084048}. Otherwise, the patient has not required any recent emergency interventions for hypoglycemia and has not had recent hospitalizations secondary to hyper or hypoglycemic episodes.    ROS: As per HPI and as detailed below: ROS     HOME DIABETES REGIMEN:  Basaglar 25 units daily Metformin 1000 mg twice daily Ozempic 0.25 mg weekly    METER DOWNLOAD SUMMARY: Date range evaluated: *** Fingerstick Blood Glucose Tests = *** Average Number Tests/Day = *** Overall Mean FS Glucose = *** Standard Deviation = ***  BG Ranges: Low = *** High = ***   Hypoglycemic Events/30 Days: BG < 50 = *** Episodes of symptomatic severe hypoglycemia = ***    DIABETIC COMPLICATIONS: Microvascular complications:    Denies: neuropathy, nephropathy   Last eye exam: Completed 07/2018  Macrovascular complications:    Denies: CAD, CVA, PVD   HISTORY:  Past Medical History:  Past Medical History:  Diagnosis Date  . Diabetes mellitus without complication (HCC)   . Obesity      Past Surgical History: No past surgical history on file.  Social History:  reports that he has never smoked. He has never used smokeless tobacco. He reports that he does not drink alcohol or use drugs. Family History:  Family History  Problem Relation Age of Onset  . Thyroid cancer Mother   . Diabetes Maternal Grandmother       HOME MEDICATIONS: Allergies as of 10/11/2018      Reactions   Zithromax [azithromycin] Anaphylaxis   Throat swells      Medication List       Accurate as of October 11, 2018 10:18 AM. Always use your most recent med list.        Basaglar KwikPen 100 UNIT/ML Sopn Inject 0.25 mLs (25 Units total) into the skin daily.   HYDROcodone-acetaminophen 5-325 MG tablet Commonly  known as:  NORCO/VICODIN Take 1 tablet by mouth every 6 (six) hours as needed for severe pain.   Insulin Pen Needle 32G X 6 MM Misc Commonly known as:  BD Pen Needle Micro U/F Once daily   metFORMIN 500 MG 24 hr tablet Commonly known as:  GLUCOPHAGE-XR Take 1,000 mg by mouth daily at 12 noon.   pravastatin 40 MG tablet Commonly known as:  PRAVACHOL Take 40 mg by mouth daily at 12 noon.   Semaglutide(0.25 or 0.5MG /DOS) 2  MG/1.5ML Sopn Commonly known as:  Ozempic (0.25 or 0.5 MG/DOSE) Inject 0.5 mg into the skin once a week.        OBJECTIVE:   Vital Signs: There were no vitals taken for this visit.  Wt Readings from Last 3 Encounters:  07/12/18 251 lb (113.9 kg)  03/05/16 (!) 330 lb (149.7 kg)     Exam: General: Pt appears well and is in NAD  Neck: General: Supple without adenopathy. Thyroid: Thyroid size normal.  No goiter or nodules appreciated. No thyroid bruit.  Lungs: Clear with good BS bilat with no rales, rhonchi, or wheezes  Heart: RRR with normal S1 and S2 and no gallops; no murmurs; no rub  Abdomen: Normoactive bowel sounds, soft, nontender, without masses or organomegaly palpable  Extremities: No pretibial edema. No tremor.  Skin: Normal texture and temperature to palpation. No rash noted. No Acanthosis nigricans/skin tags. No lipohypertrophy.  Neuro: MS is good with appropriate affect, pt is alert and Ox3       DM foot exam: 07/12/2018 The skin of the feet is intact without sores or ulcerations. The pedal pulses are 2+ on right and 2+ on left. The sensation is intact to a screening 5.07, 10 gram monofilament bilaterally         DATA REVIEWED:  02/08/2018  A1c 13.3%    ASSESSMENT / PLAN / RECOMMENDATIONS:   1) Type 2 diabetes Mellitus, poorly controlled, With out complications - Most recent A1c of *** %. Goal A1c < 7.0 %.  ***  Plan: MEDICATIONS:  ***  EDUCATION / INSTRUCTIONS:  BG monitoring instructions: Patient is instructed to check his blood sugars *** times a day, ***.  Call [place] [facility] if: BG persistently < 70 or > 300. . I reviewed the Rule of 15 for the treatment of hypoglycemia in detail with the patient. Literature supplied.    2) Diabetic complications:   Eye: Does *** have known diabetic retinopathy.   Neuro/ Feet: Does *** have known diabetic peripheral neuropathy .   Renal: Patient does not have known baseline CKD. He   is  not on an ACEI/ARB at present.   3) Lipids: Patient is  on a statin.  4) Hypertension: *** at goal of < 140/90 mmHg.    F/U in ***    Signed electronically by: Levi Herrlich, MD  [facility]  [facility] [address] [place], [place] [address] Phone: [phone] FAX: [phone]   CC: No primary care provider on file. No primary provider on file. Phone: None  Fax: None  Return to Endocrinology clinic as below: Future Appointments  Date Time Provider Department Center  10/11/2018  1:20 PM Roanna Reaves, Konrad Dolores, MD [facility] None

## 2018-10-11 NOTE — Telephone Encounter (Signed)
Patient no showed today's appt. Please advise on how to follow up. °A. No follow up necessary. °B. Follow up urgent. Contact patient immediately. °C. Follow up necessary. Contact patient and schedule visit in ___ days. °D. Follow up advised. Contact patient and schedule visit in ____weeks. ° °Would you like the NS fee to be applied to this visit? ° °

## 2018-10-15 ENCOUNTER — Telehealth: Payer: Self-pay | Admitting: Internal Medicine

## 2018-10-15 NOTE — Telephone Encounter (Signed)
Received a fax from our phone service so I called pt to inform him that Dr. Lonzo Cloud does not wish to see him any longer due to many no shows and cancellations, pt stated that he was fine with that because he was not happy with the doctor's service any way.

## 2018-10-17 ENCOUNTER — Telehealth: Payer: Self-pay | Admitting: Internal Medicine

## 2018-10-18 ENCOUNTER — Encounter: Payer: Self-pay | Admitting: Internal Medicine

## 2018-10-19 NOTE — Telephone Encounter (Signed)
error 

## 2018-10-21 ENCOUNTER — Telehealth: Payer: Self-pay | Admitting: Internal Medicine

## 2018-10-21 NOTE — Telephone Encounter (Signed)
Patient dismissed from [facility] by Lesly Dukes MD, effective 10/18/18. Dismissal Letter sent out by 1st class mail. KLM

## 2019-06-24 ENCOUNTER — Emergency Department (HOSPITAL_BASED_OUTPATIENT_CLINIC_OR_DEPARTMENT_OTHER)
Admission: EM | Admit: 2019-06-24 | Discharge: 2019-06-24 | Disposition: A | Discharge status: Home / Self Care | Payer: Managed Care, Other (non HMO) | Attending: Emergency Medicine | Admitting: Emergency Medicine

## 2019-06-24 ENCOUNTER — Encounter (HOSPITAL_BASED_OUTPATIENT_CLINIC_OR_DEPARTMENT_OTHER): Payer: Self-pay | Admitting: Emergency Medicine

## 2019-06-24 ENCOUNTER — Other Ambulatory Visit: Payer: Self-pay

## 2019-06-24 DIAGNOSIS — Y92410 Unspecified street and highway as the place of occurrence of the external cause: Secondary | ICD-10-CM | POA: Insufficient documentation

## 2019-06-24 DIAGNOSIS — Y93I9 Activity, other involving external motion: Secondary | ICD-10-CM | POA: Insufficient documentation

## 2019-06-24 DIAGNOSIS — S0990XA Unspecified injury of head, initial encounter: Secondary | ICD-10-CM

## 2019-06-24 DIAGNOSIS — Y998 Other external cause status: Secondary | ICD-10-CM | POA: Diagnosis not present

## 2019-06-24 NOTE — ED Notes (Signed)
Patient transported to Ultrasound 
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
## 2019-06-24 NOTE — Discharge Instructions (Signed)
Take Tylenol or Ibuprofen for headache Return if you are worsening
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
## 2019-09-30 ENCOUNTER — Ambulatory Visit (INDEPENDENT_AMBULATORY_CARE_PROVIDER_SITE_OTHER): Payer: Managed Care, Other (non HMO) | Admitting: Otolaryngology

## 2019-09-30 ENCOUNTER — Other Ambulatory Visit: Payer: Self-pay

## 2019-09-30 VITALS — Temp 97.9°F

## 2019-09-30 DIAGNOSIS — H6123 Impacted cerumen, bilateral: Secondary | ICD-10-CM

## 2019-09-30 NOTE — Progress Notes (Signed)
HPI: Levi Hanson is a 49 y.o. male who presents for evaluation of ear canals and blockage of his hearing.  He last had his ears cleaned 2 years ago and he feels like they need to be cleaned again.  Denies any ear pain or drainage..  Past Medical History:  Diagnosis Date  . Diabetes mellitus without complication (HCC)   . Obesity    Past Surgical History:  Procedure Laterality Date  . BACK SURGERY    . VASECTOMY     Social History   Socioeconomic History  . Marital status: Married    Spouse name: Not on file  . Number of children: Not on file  . Years of education: Not on file  . Highest education level: Not on file  Occupational History  . Not on file  Tobacco Use  . Smoking status: Never Smoker  . Smokeless tobacco: Never Used  Substance and Sexual Activity  . Alcohol use: No  . Drug use: No  . Sexual activity: Not on file  Other Topics Concern  . Not on file  Social History Narrative  . Not on file   Social Determinants of Health   Financial Resource Strain:   . Difficulty of Paying Living Expenses: Not on file  Food Insecurity:   . Worried About Programme researcher, broadcasting/film/video in the Last Year: Not on file  . Ran Out of Food in the Last Year: Not on file  Transportation Needs:   . Lack of Transportation (Medical): Not on file  . Lack of Transportation (Non-Medical): Not on file  Physical Activity:   . Days of Exercise per Week: Not on file  . Minutes of Exercise per Session: Not on file  Stress:   . Feeling of Stress : Not on file  Social Connections:   . Frequency of Communication with Friends and Family: Not on file  . Frequency of Social Gatherings with Friends and Family: Not on file  . Attends Religious Services: Not on file  . Active Member of Clubs or Organizations: Not on file  . Attends Banker Meetings: Not on file  . Marital Status: Not on file   Family History  Problem Relation Age of Onset  . Thyroid cancer Mother   . Diabetes Maternal  Grandmother    Allergies  Allergen Reactions  . Zithromax [Azithromycin] Anaphylaxis    Throat swells   Prior to Admission medications   Medication Sig Start Date End Date Taking? Authorizing Provider  aspirin EC 81 MG tablet Take 81 mg by mouth daily.   Yes [provider]  HYDROcodone-acetaminophen (NORCO/VICODIN) 5-325 MG per tablet Take 1 tablet by mouth every 6 (six) hours as needed for severe pain. 12/24/13  Yes Fayrene Helper, PA-C  Insulin Glargine (BASAGLAR KWIKPEN) 100 UNIT/ML SOPN Inject 0.25 mLs (25 Units total) into the skin daily. 07/12/18  Yes Shamleffer, Konrad Dolores, MD  Insulin Pen Needle (BD PEN NEEDLE MICRO U/F) 32G X 6 MM MISC Once daily 07/12/18  Yes Shamleffer, Konrad Dolores, MD  metFORMIN (GLUCOPHAGE-XR) 500 MG 24 hr tablet Take 1,000 mg by mouth daily at 12 noon.   Yes [provider]  pravastatin (PRAVACHOL) 40 MG tablet Take 40 mg by mouth daily at 12 noon.   Yes [provider]  Semaglutide,0.25 or 0.5MG /DOS, (OZEMPIC, 0.25 OR 0.5 MG/DOSE,) 2 MG/1.5ML SOPN Inject 0.5 mg into the skin once a week. 07/12/18  Yes Shamleffer, Konrad Dolores, MD     Positive ROS: Otherwise negative  All other systems have been reviewed and were otherwise negative with the exception of those mentioned in the HPI and as above.  Physical Exam: Constitutional: Alert, well-appearing, no acute distress Ears: External ears without lesions or tenderness. Ear canals have a mild amount of cerumen on both sides that was cleaned with curettes and forceps.  TMs were clear bilaterally.  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 Clear nasal passages Oral: Oropharynx clear. Neck: No palpable adenopathy or masses Respiratory: Breathing comfortably  Skin: No facial/neck lesions or rash noted.  Cerumen impaction removal  Date/Time: 09/30/2019 5:15 PM Performed by: Rozetta Nunnery, MD Authorized by: Rozetta Nunnery, MD    Consent:    Consent obtained:  Verbal   Consent given by:  Patient   Risks discussed:  Pain and bleeding Procedure details:    Location:  L ear and R ear   Procedure type: curette and forceps   Post-procedure details:    Inspection:  TM intact and canal normal   Hearing quality:  Improved   Patient tolerance of procedure:  Tolerated well, no immediate complications Comments:     TMs clear bilaterally    Assessment: Cerumen impactions  Plan: He will follow-up as needed  Radene Journey, MD

## 2021-05-02 ENCOUNTER — Ambulatory Visit (INDEPENDENT_AMBULATORY_CARE_PROVIDER_SITE_OTHER): Payer: Managed Care, Other (non HMO) | Admitting: Otolaryngology
